# Patient Record
Sex: Male | Born: 1955 | Race: White | Hispanic: No | Marital: Married
Health system: Southern US, Community
[De-identification: ages and names within clinical notes are randomized; demographics above are authoritative.]

## PROBLEM LIST (undated history)

## (undated) DIAGNOSIS — K219 Gastro-esophageal reflux disease without esophagitis: Secondary | ICD-10-CM

## (undated) DIAGNOSIS — I10 Essential (primary) hypertension: Secondary | ICD-10-CM

## (undated) DIAGNOSIS — I2510 Atherosclerotic heart disease of native coronary artery without angina pectoris: Secondary | ICD-10-CM

## (undated) DIAGNOSIS — F419 Anxiety disorder, unspecified: Secondary | ICD-10-CM

## (undated) DIAGNOSIS — R55 Syncope and collapse: Secondary | ICD-10-CM

## (undated) DIAGNOSIS — E785 Hyperlipidemia, unspecified: Secondary | ICD-10-CM

## (undated) HISTORY — PX: TONSILLECTOMY: SUR1361

## (undated) HISTORY — PX: CORONARY ANGIOPLASTY: SHX604

## (undated) HISTORY — PX: APPENDECTOMY: SHX54

---

## 2015-09-07 ENCOUNTER — Encounter
Admission: RE | Disposition: A | Discharge status: Home / Self Care | Payer: Self-pay | Source: Ambulatory Visit | Attending: Gastroenterology

## 2015-09-07 ENCOUNTER — Ambulatory Visit: Payer: BC Managed Care – PPO | Admitting: Anesthesiology

## 2015-09-07 ENCOUNTER — Ambulatory Visit
Admission: RE | Admit: 2015-09-07 | Discharge: 2015-09-07 | Disposition: A | Discharge status: Home / Self Care | Payer: BC Managed Care – PPO | Source: Ambulatory Visit | Attending: Gastroenterology | Admitting: Gastroenterology

## 2015-09-07 ENCOUNTER — Encounter: Payer: Self-pay | Admitting: Anesthesiology

## 2015-09-07 DIAGNOSIS — K219 Gastro-esophageal reflux disease without esophagitis: Secondary | ICD-10-CM | POA: Diagnosis not present

## 2015-09-07 DIAGNOSIS — Z9862 Peripheral vascular angioplasty status: Secondary | ICD-10-CM | POA: Insufficient documentation

## 2015-09-07 DIAGNOSIS — K640 First degree hemorrhoids: Secondary | ICD-10-CM | POA: Insufficient documentation

## 2015-09-07 DIAGNOSIS — Z7982 Long term (current) use of aspirin: Secondary | ICD-10-CM | POA: Insufficient documentation

## 2015-09-07 DIAGNOSIS — E785 Hyperlipidemia, unspecified: Secondary | ICD-10-CM | POA: Insufficient documentation

## 2015-09-07 DIAGNOSIS — K644 Residual hemorrhoidal skin tags: Secondary | ICD-10-CM | POA: Insufficient documentation

## 2015-09-07 DIAGNOSIS — Z79899 Other long term (current) drug therapy: Secondary | ICD-10-CM | POA: Diagnosis not present

## 2015-09-07 DIAGNOSIS — I2510 Atherosclerotic heart disease of native coronary artery without angina pectoris: Secondary | ICD-10-CM | POA: Insufficient documentation

## 2015-09-07 DIAGNOSIS — K625 Hemorrhage of anus and rectum: Secondary | ICD-10-CM | POA: Diagnosis not present

## 2015-09-07 DIAGNOSIS — F419 Anxiety disorder, unspecified: Secondary | ICD-10-CM | POA: Insufficient documentation

## 2015-09-07 DIAGNOSIS — I10 Essential (primary) hypertension: Secondary | ICD-10-CM | POA: Insufficient documentation

## 2015-09-07 HISTORY — DX: Hyperlipidemia, unspecified: E78.5

## 2015-09-07 HISTORY — DX: Atherosclerotic heart disease of native coronary artery without angina pectoris: I25.10

## 2015-09-07 HISTORY — DX: Anxiety disorder, unspecified: F41.9

## 2015-09-07 HISTORY — DX: Syncope and collapse: R55

## 2015-09-07 HISTORY — PX: COLONOSCOPY WITH PROPOFOL: SHX5780

## 2015-09-07 HISTORY — DX: Essential (primary) hypertension: I10

## 2015-09-07 HISTORY — DX: Gastro-esophageal reflux disease without esophagitis: K21.9

## 2015-09-07 SURGERY — COLONOSCOPY WITH PROPOFOL
Anesthesia: General

## 2015-09-07 MED ORDER — PROPOFOL 500 MG/50ML IV EMUL
INTRAVENOUS | Status: DC | PRN
  Administered 2015-09-07: 120 ug/kg/min via INTRAVENOUS

## 2015-09-07 MED ORDER — SODIUM CHLORIDE 0.9 % IV SOLN: INTRAVENOUS | Status: DC

## 2015-09-07 MED ORDER — ONDANSETRON HCL 4 MG/2ML IJ SOLN: 4.0000 mg | Freq: Once | INTRAMUSCULAR | Status: DC | PRN

## 2015-09-07 MED ORDER — SODIUM CHLORIDE 0.9 % IR SOLN: 1000.0000 mL | Freq: Once | Status: DC

## 2015-09-07 MED ORDER — SODIUM CHLORIDE 0.9 % IV SOLN
INTRAVENOUS | Status: DC
  Administered 2015-09-07: 14:00:00 via INTRAVENOUS

## 2015-09-07 MED ORDER — FENTANYL CITRATE (PF) 100 MCG/2ML IJ SOLN: 25.0000 ug | INTRAMUSCULAR | Status: DC | PRN

## 2015-09-07 NOTE — Transfer of Care (Signed)
Immediate Anesthesia Transfer of Care Note  Patient: Henry Dennis  Procedure(s) Performed: Procedure(s): COLONOSCOPY WITH PROPOFOL (N/A)  Patient Location: PACU  Anesthesia Type:General  Level of Consciousness: awake, alert , oriented and sedated  Airway & Oxygen Therapy: Patient Spontanous Breathing and Patient connected to nasal cannula oxygen  Post-op Assessment: Report given to RN and Post -op Vital signs reviewed and stable  Post vital signs: Reviewed and stable  Last Vitals:  Filed Vitals:   09/07/15 1424  BP: 166/98  Pulse: 79  Temp: 36.1 C  Resp: 18    Complications: No apparent anesthesia complications

## 2015-09-07 NOTE — Anesthesia Postprocedure Evaluation (Signed)
Anesthesia Post Note  Patient: Henry Dennis  Procedure(s) Performed: Procedure(s) (LRB): COLONOSCOPY WITH PROPOFOL (N/A)  Patient location during evaluation: PACU Anesthesia Type: General Level of consciousness: awake and alert and oriented Pain management: pain level controlled Vital Signs Assessment: post-procedure vital signs reviewed and stable Respiratory status: spontaneous breathing Cardiovascular status: blood pressure returned to baseline Anesthetic complications: no    Last Vitals:  Filed Vitals:   09/07/15 1522 09/07/15 1532  BP: 110/63 136/78  Pulse: 60 64  Temp: 36.6 C   Resp: 12 18    Last Pain: There were no vitals filed for this visit.               Ariely Riddell

## 2015-09-07 NOTE — Anesthesia Procedure Notes (Signed)
Performed by: COOK-MARTIN, Kenijah Ben[place] Pre-anesthesia Checklist: Patient identified, Emergency Drugs available, Suction available, Patient being monitored and Timeout performed Patient Re-evaluated:Patient Re-evaluated prior to inductionOxygen Delivery Method: Nasal cannula Preoxygenation: Pre-oxygenation with 100% oxygen Intubation Type: IV induction Placement Confirmation: positive ETCO2 and CO2 detector       

## 2015-09-07 NOTE — Anesthesia Preprocedure Evaluation (Addendum)
Anesthesia Evaluation  Patient identified by MRN, date of birth, ID band Patient awake    Reviewed: Allergy & Precautions, NPO status , Patient's Chart, lab work & pertinent test results  History of Anesthesia Complications (+) AWARENESS UNDER ANESTHESIA  Airway Mallampati: III  TM Distance: <3 FB     Dental  (+) Chipped   Pulmonary  Sinus allergies   Pulmonary exam normal breath sounds clear to auscultation       Cardiovascular hypertension, Pt. on medications + CAD  Normal cardiovascular exam  Hx of coronary stent   Neuro/Psych    GI/Hepatic GERD  Medicated and Controlled,  Endo/Other    Renal/GU      Musculoskeletal   Abdominal Normal abdominal exam  (+)   Peds  Hematology   Anesthesia Other Findings Increased cholesterol  Reproductive/Obstetrics                          Anesthesia Physical Anesthesia Plan  ASA: III  Anesthesia Plan: General   Post-op Pain Management:    Induction: Intravenous  Airway Management Planned: Nasal Cannula  Additional Equipment:   Intra-op Plan:   Post-operative Plan:   Informed Consent: I have reviewed the patients History and Physical, chart, labs and discussed the procedure including the risks, benefits and alternatives for the proposed anesthesia with the patient or authorized representative who has indicated his/her understanding and acceptance.   Dental advisory given  Plan Discussed with: CRNA and Surgeon  Anesthesia Plan Comments:         Anesthesia Quick Evaluation

## 2015-09-07 NOTE — H&P (Signed)
  Primary Care Physician:  Berneice Gandy, MD  Pre-Procedure History & Physical: HPI:  Henry Dennis is a 60 y.o. male is here for an colonoscopy.   Past Medical History  Diagnosis Date  . Anxiety   . Coronary artery disease   . GERD (gastroesophageal reflux disease)   . Hyperlipidemia   . Hypertension   . Syncope     Past Surgical History  Procedure Laterality Date  . Appendectomy    . Coronary angioplasty    . Tonsillectomy      Prior to Admission medications   Medication Sig Start Date End Date Taking? Authorizing Provider  aspirin 162 MG EC tablet Take 325 mg by mouth daily.    Yes Historical Provider, MD  atorvastatin (LIPITOR) 80 MG tablet Take 80 mg by mouth daily.   Yes Historical Provider, MD  cetirizine (ZYRTEC) 10 MG tablet Take 10 mg by mouth daily.   Yes Historical Provider, MD  lisinopril-hydrochlorothiazide (PRINZIDE,ZESTORETIC) 20-25 MG tablet Take 1 tablet by mouth daily.   Yes Historical Provider, MD  ranitidine (ZANTAC) 150 MG tablet Take 150 mg by mouth 2 (two) times daily.   Yes Historical Provider, MD  sildenafil (VIAGRA) 25 MG tablet Take 25 mg by mouth daily as needed for erectile dysfunction.   Yes Historical Provider, MD  nitroGLYCERIN (NITROSTAT) 0.4 MG SL tablet Place 0.4 mg under the tongue every 5 (five) minutes as needed for chest pain. Reported on 09/07/2015    Historical Provider, MD    Allergies as of 09/07/2015  . (Not on File)    History reviewed. No pertinent family history.  Social History   Social History  . Marital Status: Married    Spouse Name: N/A  . Number of Children: N/A  . Years of Education: N/A   Occupational History  . Not on file.   Social History Main Topics  . Smoking status: Not on file  . Smokeless tobacco: Not on file  . Alcohol Use: Not on file  . Drug Use: Not on file  . Sexual Activity: Not on file   Other Topics Concern  . Not on file   Social History Narrative  . No narrative on file      Physical Exam: BP 166/98 mmHg  Pulse 79  Temp(Src) 97 F (36.1 C) (Oral)  Resp 18  Ht  (1.778 m)  Wt 97.07 kg (214 lb)  BMI 30.71 kg/m2  SpO2 99% General:   Alert,  pleasant and cooperative in NAD Head:  Normocephalic and atraumatic. Neck:  Supple; no masses or thyromegaly. Lungs:  Clear throughout to auscultation.    Heart:  Regular rate and rhythm. Abdomen:  Soft, nontender and nondistended. Normal bowel sounds, without guarding, and without rebound.   Neurologic:  Alert and  oriented x4;  grossly normal neurologically.  Impression/Plan: Henry Dennis is here for an colonoscopy to be performed for rectal bleeding  Risks, benefits, limitations, and alternatives regarding  colonoscopy have been reviewed with the patient.  Questions have been answered.  All parties agreeable.   Henry Maxwell, MD  09/07/2015, 2:46 PM

## 2015-09-07 NOTE — Op Note (Signed)
[facility] Patient Name: Henry Dennis Procedure Date: 09/07/2015 2:57 PM MRN: [id number] Account #: [id number] Date of Birth: 1955-11-15 Admit Type: Outpatient Age: 60 Room: [facility] ENDO ROOM 2 Gender: Male Note Status: Finalized Procedure:         Colonoscopy Indications:       This is the patient's first colonoscopy, Rectal bleeding Patient Profile:   This is a 60 year old male. Providers:         Rhona Raider. Shelle Iron, MD Referring MD:      Donnie Aho MD Medicines:         Propofol per Anesthesia Complications:     No immediate complications. Procedure:         Pre-Anesthesia Assessment:                    - Prior to the procedure, a History and Physical was                     performed, and patient medications, allergies and                     sensitivities were reviewed. The patient's tolerance of                     previous anesthesia was reviewed.                    After obtaining informed consent, the colonoscope was                     passed under direct vision. Throughout the procedure, the                     patient's blood pressure, pulse, and oxygen saturations                     were monitored continuously. The Colonoscope was                     introduced through the anus and advanced to the the cecum,                     identified by appendiceal orifice and ileocecal valve. The                     colonoscopy was performed without difficulty. The patient                     tolerated the procedure well. The quality of the bowel                     preparation was good. Findings:      The perianal exam findings include non-thrombosed external hemorrhoids.      Internal hemorrhoids were found during retroflexion. The hemorrhoids       were Grade I (internal hemorrhoids that do not prolapse).      The exam was otherwise without abnormality. Impression:        - Non-thrombosed external hemorrhoids found on perianal              exam.                    - Internal hemorrhoids.                    -  The examination was otherwise normal.                    - No specimens collected.                    - Rectal bleeding likely hemorrhoidal in etiology. Recommendation:    - Observe patient in GI recovery unit.                    - Resume regular diet.                    - Continue present medications.                    - Repeat colonoscopy in 10 years for screening purposes.                    - Return to referring physician.                    - The findings and recommendations were discussed with the                     patient.                    - The findings and recommendations were discussed with the                     patient's family. Procedure Code(s): --- Professional ---                    [phone], Colonoscopy, flexible; diagnostic, including                     collection of specimen(s) by brushing or washing, when                     performed (separate procedure) Diagnosis Code(s): --- Professional ---                    K64.0, First degree hemorrhoids                    K64.4, Residual hemorrhoidal skin tags                    K62.5, Hemorrhage of anus and rectum CPT copyright 2014 [employer]. All rights reserved. The codes documented in this report are preliminary and upon coder review may  be revised to meet current compliance requirements. Kathalene Frames, MD 09/07/2015 3:22:53 PM This report has been signed electronically. Number of Addenda: 0 Note Initiated On: 09/07/2015 2:57 PM Scope Withdrawal Time: 0 hours 10 minutes 19 seconds  Total Procedure Duration: 0 hours 16 minutes 45 seconds       [facility]

## 2015-09-07 NOTE — Discharge Instructions (Signed)

## 2015-09-09 ENCOUNTER — Encounter: Payer: Self-pay | Admitting: Gastroenterology

## 2018-08-14 ENCOUNTER — Other Ambulatory Visit: Payer: Self-pay | Admitting: Family Medicine

## 2018-08-14 DIAGNOSIS — R590 Localized enlarged lymph nodes: Secondary | ICD-10-CM

## 2018-08-21 ENCOUNTER — Ambulatory Visit
Admission: RE | Admit: 2018-08-21 | Discharge: 2018-08-21 | Disposition: A | Discharge status: Home / Self Care | Payer: BC Managed Care – PPO | Source: Ambulatory Visit | Attending: Family Medicine | Admitting: Family Medicine

## 2018-08-21 ENCOUNTER — Encounter (INDEPENDENT_AMBULATORY_CARE_PROVIDER_SITE_OTHER): Payer: Self-pay

## 2018-08-21 DIAGNOSIS — R590 Localized enlarged lymph nodes: Secondary | ICD-10-CM | POA: Diagnosis not present

## 2018-08-21 MED ORDER — IOHEXOL 300 MG/ML  SOLN
75.0000 mL | Freq: Once | INTRAMUSCULAR | Status: AC | PRN
  Administered 2018-08-21: 75 mL via INTRAVENOUS

## 2019-02-20 IMAGING — CT CT NECK W/ CM
2 of 3 series · 8 of 14 positions shown, 9 images · IV contrast (omnipaque)
Comparison: None.

CLINICAL DATA: Right neck mass.

EXAM:
CT NECK WITH CONTRAST
TECHNIQUE: Multidetector CT imaging of the neck was performed using the
standard protocol following the bolus administration of intravenous
contrast.
CONTRAST:  75mL OMNIPAQUE IOHEXOL 300 MG/ML  SOLN

[Series 2: axial neck · axial · 0.61mm/px · z∈[-384,-224]mm · 4 of 134 slices shown]
[im 27/134  bone]
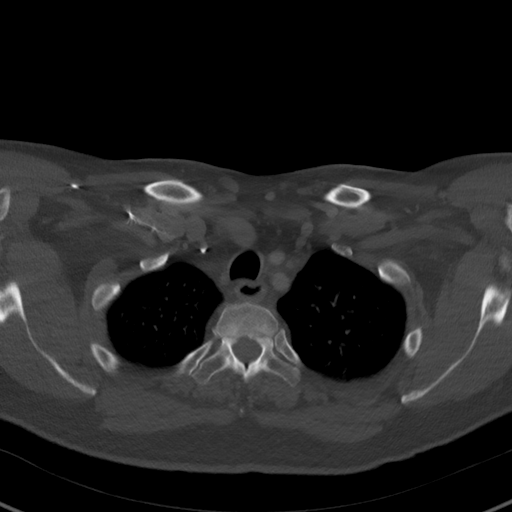
[im 54/134  bone]
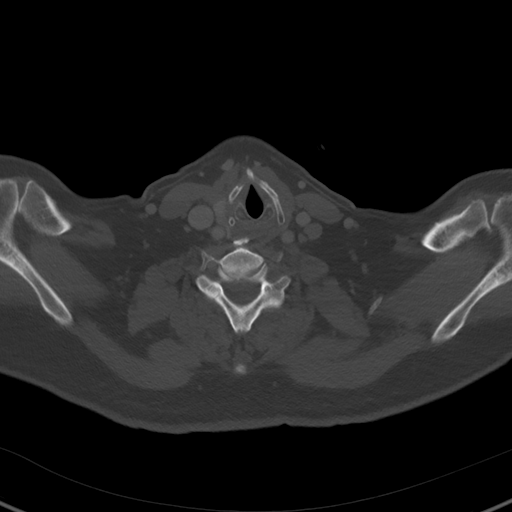
[im 80/134  bone]
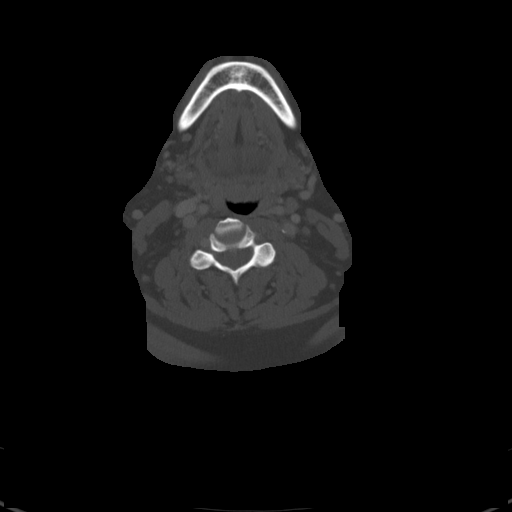
[im 107/134  bone]
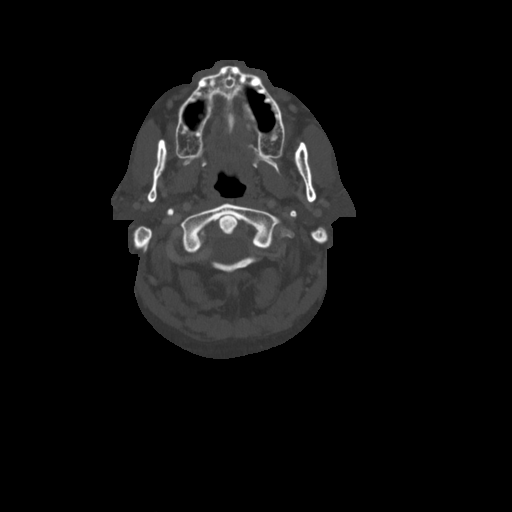

[Series 8: orthogonal ax · axial · 0.42mm/px · z∈[-430,-243]mm · 4 of 161 slices shown, 5 images]
[im 33/161  soft-tissue]
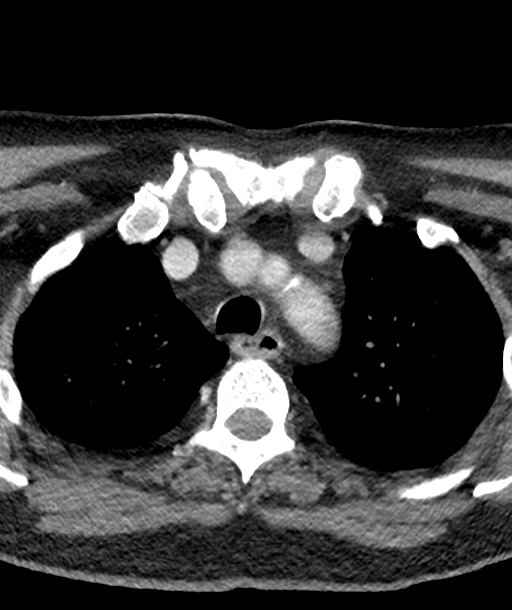
[im 33/161  bone]
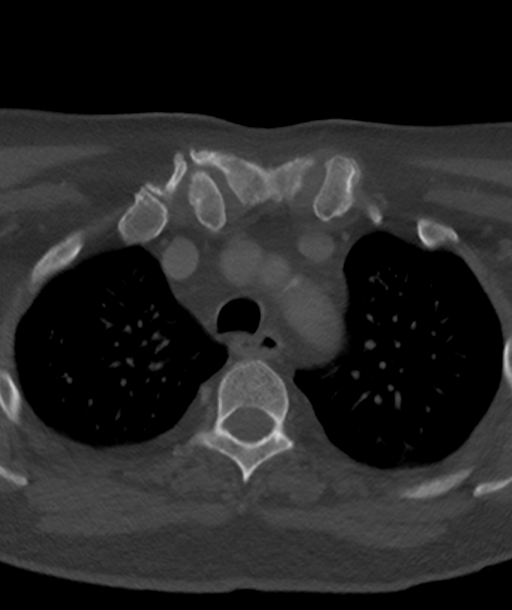
[im 65/161  bone]
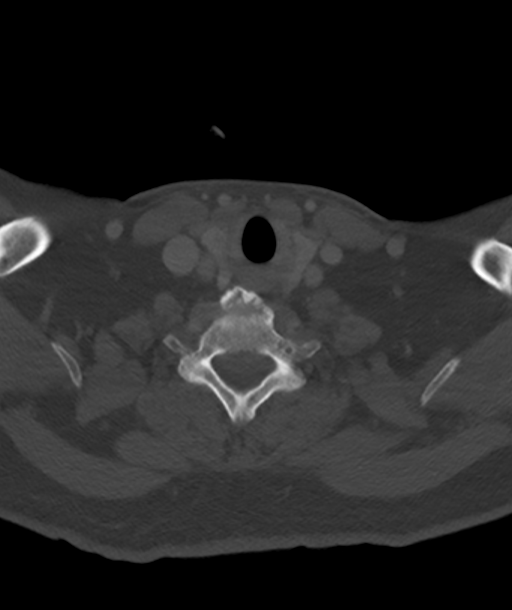
[im 97/161  bone]
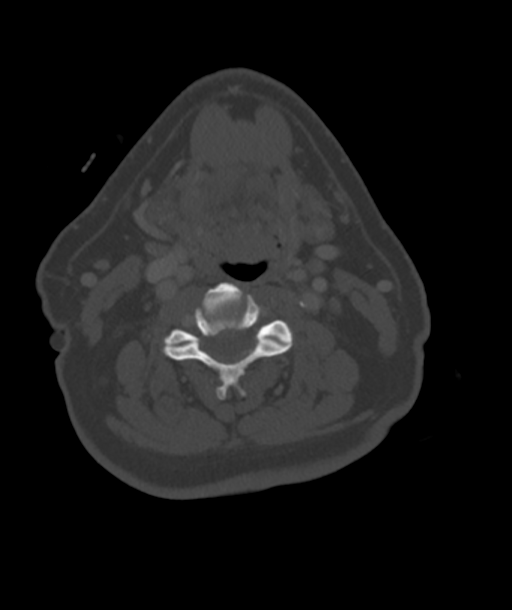
[im 129/161  bone]
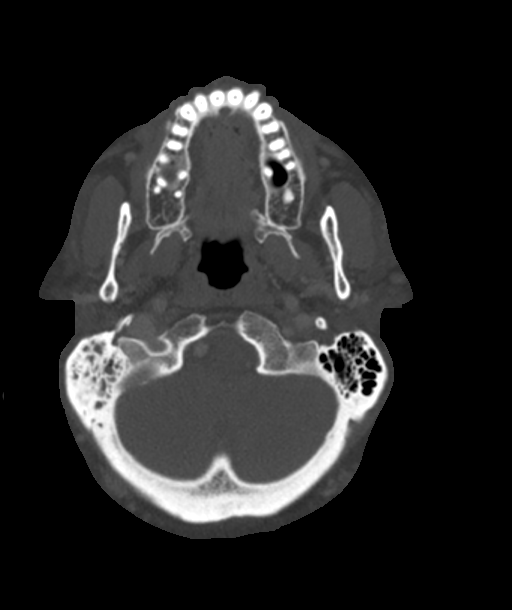

[8 of 14 positions shown; findings below may reference images not displayed]

FINDINGS: Pharynx and larynx: Prominent lingual tonsil bilaterally without
focal mass. Remainder of the pharynx normal. Normal airway. Normal
larynx.

Salivary glands: No inflammation, mass, or stone.

Thyroid: Negative

Lymph nodes: No enlarged lymph nodes. Vitamin-E capsule placed in
the right lateral neck at the area of patient concern. No mass or
adenopathy in this area.

Vascular: Normal vascular enhancement bilaterally. Mild
calcification carotid bifurcation bilaterally.

Limited intracranial: Negative

Visualized orbits: Negative

Mastoids and visualized paranasal sinuses: Mild mucosal edema right
maxillary sinus and right mastoid sinus otherwise clear

Skeleton: No acute skeletal abnormality.

Upper chest: Lung apices clear bilaterally.

Other: None
IMPRESSION: No imaging correlate of palpable area in the right lateral neck.

Prominent lingual tonsil bilaterally without definite mass lesion.
If there is further concern of this finding, direct visualization
could be performed.

## 2019-09-14 ENCOUNTER — Ambulatory Visit: Payer: BC Managed Care – PPO

## 2019-09-22 ENCOUNTER — Ambulatory Visit: Payer: Medicare PPO | Attending: Internal Medicine

## 2019-09-22 DIAGNOSIS — Z23 Encounter for immunization: Secondary | ICD-10-CM

## 2019-09-22 NOTE — Progress Notes (Signed)
   Covid-19 Vaccination Clinic  Name:  Henry Dennis    MRN: [id number] DOB: 1956-02-04  09/22/2019  Henry Dennis was observed post Covid-19 immunization for 30 minutes based on pre-vaccination screening without incidence. He was provided with Vaccine Information Sheet and instruction to access the V-Safe system.   Henry Dennis was instructed to call 911 with any severe reactions post vaccine: Marland Kitchen Difficulty breathing  . Swelling of your face and throat  . A fast heartbeat  . A bad rash all over your body  . Dizziness and weakness    Immunizations Administered    Name Date Dose VIS Date Route   Pfizer COVID-19 Vaccine 09/22/2019  9:11 AM 0.3 mL 07/26/2019 Intramuscular   Manufacturer: [employer], [employer]   Lot: [id number]   NDC: [address]-7

## 2019-09-25 ENCOUNTER — Ambulatory Visit: Payer: BC Managed Care – PPO

## 2019-10-16 ENCOUNTER — Ambulatory Visit: Payer: Medicare PPO | Attending: Internal Medicine

## 2019-10-16 DIAGNOSIS — Z23 Encounter for immunization: Secondary | ICD-10-CM

## 2019-10-16 NOTE — Progress Notes (Signed)
   Covid-19 Vaccination Clinic  Name:  Henry Dennis    MRN: [id number] DOB: 02/03/1956  10/16/2019  [name] was observed post Covid-19 immunization for 15 minutes without incident. He was provided with Vaccine Information Sheet and instruction to access the V-Safe system.   [name] was instructed to call 911 with any severe reactions post vaccine: Marland Kitchen Difficulty breathing  . Swelling of face and throat  . A fast heartbeat  . A bad rash all over body  . Dizziness and weakness   Immunizations Administered    Name Date Dose VIS Date Route   Pfizer COVID-19 Vaccine 10/16/2019  3:34 PM 0.3 mL 07/26/2019 Intramuscular   Manufacturer: [employer], [employer]   Lot: [id number]   NDC: [address]-5

## 2022-04-27 DIAGNOSIS — Z955 Presence of coronary angioplasty implant and graft: Secondary | ICD-10-CM | POA: Diagnosis not present

## 2022-04-27 DIAGNOSIS — Z125 Encounter for screening for malignant neoplasm of prostate: Secondary | ICD-10-CM | POA: Diagnosis not present

## 2022-04-27 DIAGNOSIS — E119 Type 2 diabetes mellitus without complications: Secondary | ICD-10-CM | POA: Diagnosis not present

## 2022-04-27 DIAGNOSIS — E7800 Pure hypercholesterolemia, unspecified: Secondary | ICD-10-CM | POA: Diagnosis not present

## 2022-04-27 DIAGNOSIS — K219 Gastro-esophageal reflux disease without esophagitis: Secondary | ICD-10-CM | POA: Diagnosis not present

## 2022-04-27 DIAGNOSIS — I10 Essential (primary) hypertension: Secondary | ICD-10-CM | POA: Diagnosis not present

## 2022-05-26 DIAGNOSIS — H25042 Posterior subcapsular polar age-related cataract, left eye: Secondary | ICD-10-CM | POA: Diagnosis not present

## 2022-05-26 DIAGNOSIS — I10 Essential (primary) hypertension: Secondary | ICD-10-CM | POA: Diagnosis not present

## 2022-05-26 DIAGNOSIS — H0288B Meibomian gland dysfunction left eye, upper and lower eyelids: Secondary | ICD-10-CM | POA: Diagnosis not present

## 2022-05-26 DIAGNOSIS — H0288A Meibomian gland dysfunction right eye, upper and lower eyelids: Secondary | ICD-10-CM | POA: Diagnosis not present

## 2022-05-26 DIAGNOSIS — H25013 Cortical age-related cataract, bilateral: Secondary | ICD-10-CM | POA: Diagnosis not present

## 2022-05-26 DIAGNOSIS — H2513 Age-related nuclear cataract, bilateral: Secondary | ICD-10-CM | POA: Diagnosis not present

## 2022-05-26 DIAGNOSIS — H1045 Other chronic allergic conjunctivitis: Secondary | ICD-10-CM | POA: Diagnosis not present

## 2022-05-30 DIAGNOSIS — Z23 Encounter for immunization: Secondary | ICD-10-CM | POA: Diagnosis not present

## 2022-06-28 DIAGNOSIS — E7800 Pure hypercholesterolemia, unspecified: Secondary | ICD-10-CM | POA: Diagnosis not present

## 2022-06-28 DIAGNOSIS — I10 Essential (primary) hypertension: Secondary | ICD-10-CM | POA: Diagnosis not present

## 2022-06-29 DIAGNOSIS — Z125 Encounter for screening for malignant neoplasm of prostate: Secondary | ICD-10-CM | POA: Diagnosis not present

## 2022-07-01 DIAGNOSIS — H903 Sensorineural hearing loss, bilateral: Secondary | ICD-10-CM | POA: Diagnosis not present

## 2022-08-19 DIAGNOSIS — H1045 Other chronic allergic conjunctivitis: Secondary | ICD-10-CM | POA: Diagnosis not present

## 2022-08-19 DIAGNOSIS — H538 Other visual disturbances: Secondary | ICD-10-CM | POA: Diagnosis not present

## 2022-08-19 DIAGNOSIS — H0288A Meibomian gland dysfunction right eye, upper and lower eyelids: Secondary | ICD-10-CM | POA: Diagnosis not present

## 2022-08-19 DIAGNOSIS — H2513 Age-related nuclear cataract, bilateral: Secondary | ICD-10-CM | POA: Diagnosis not present

## 2022-08-19 DIAGNOSIS — H0288B Meibomian gland dysfunction left eye, upper and lower eyelids: Secondary | ICD-10-CM | POA: Diagnosis not present

## 2022-08-19 DIAGNOSIS — H25043 Posterior subcapsular polar age-related cataract, bilateral: Secondary | ICD-10-CM | POA: Diagnosis not present

## 2022-08-19 DIAGNOSIS — H25013 Cortical age-related cataract, bilateral: Secondary | ICD-10-CM | POA: Diagnosis not present

## 2022-08-19 DIAGNOSIS — I10 Essential (primary) hypertension: Secondary | ICD-10-CM | POA: Diagnosis not present

## 2022-09-15 DIAGNOSIS — F419 Anxiety disorder, unspecified: Secondary | ICD-10-CM | POA: Diagnosis not present

## 2022-09-15 DIAGNOSIS — E119 Type 2 diabetes mellitus without complications: Secondary | ICD-10-CM | POA: Diagnosis not present

## 2022-10-04 DIAGNOSIS — I10 Essential (primary) hypertension: Secondary | ICD-10-CM | POA: Diagnosis not present

## 2022-10-04 DIAGNOSIS — E119 Type 2 diabetes mellitus without complications: Secondary | ICD-10-CM | POA: Diagnosis not present

## 2022-10-04 DIAGNOSIS — E7800 Pure hypercholesterolemia, unspecified: Secondary | ICD-10-CM | POA: Diagnosis not present

## 2022-10-18 DIAGNOSIS — Z9181 History of falling: Secondary | ICD-10-CM | POA: Diagnosis not present

## 2022-10-18 DIAGNOSIS — F419 Anxiety disorder, unspecified: Secondary | ICD-10-CM | POA: Diagnosis not present

## 2022-10-18 DIAGNOSIS — E785 Hyperlipidemia, unspecified: Secondary | ICD-10-CM | POA: Diagnosis not present

## 2022-10-18 DIAGNOSIS — E119 Type 2 diabetes mellitus without complications: Secondary | ICD-10-CM | POA: Diagnosis not present

## 2022-10-18 DIAGNOSIS — N529 Male erectile dysfunction, unspecified: Secondary | ICD-10-CM | POA: Diagnosis not present

## 2022-10-18 DIAGNOSIS — I10 Essential (primary) hypertension: Secondary | ICD-10-CM | POA: Diagnosis not present

## 2022-10-18 DIAGNOSIS — Z8249 Family history of ischemic heart disease and other diseases of the circulatory system: Secondary | ICD-10-CM | POA: Diagnosis not present

## 2022-10-18 DIAGNOSIS — J309 Allergic rhinitis, unspecified: Secondary | ICD-10-CM | POA: Diagnosis not present

## 2022-10-18 DIAGNOSIS — K219 Gastro-esophageal reflux disease without esophagitis: Secondary | ICD-10-CM | POA: Diagnosis not present

## 2022-10-19 DIAGNOSIS — E119 Type 2 diabetes mellitus without complications: Secondary | ICD-10-CM | POA: Diagnosis not present

## 2022-10-26 DIAGNOSIS — I10 Essential (primary) hypertension: Secondary | ICD-10-CM | POA: Diagnosis not present

## 2022-10-26 DIAGNOSIS — E7800 Pure hypercholesterolemia, unspecified: Secondary | ICD-10-CM | POA: Diagnosis not present

## 2022-10-26 DIAGNOSIS — Z1331 Encounter for screening for depression: Secondary | ICD-10-CM | POA: Diagnosis not present

## 2022-10-26 DIAGNOSIS — K219 Gastro-esophageal reflux disease without esophagitis: Secondary | ICD-10-CM | POA: Diagnosis not present

## 2022-10-26 DIAGNOSIS — Z955 Presence of coronary angioplasty implant and graft: Secondary | ICD-10-CM | POA: Diagnosis not present

## 2022-10-26 DIAGNOSIS — Z0000 Encounter for general adult medical examination without abnormal findings: Secondary | ICD-10-CM | POA: Diagnosis not present

## 2022-10-26 DIAGNOSIS — E119 Type 2 diabetes mellitus without complications: Secondary | ICD-10-CM | POA: Diagnosis not present

## 2022-10-26 DIAGNOSIS — F419 Anxiety disorder, unspecified: Secondary | ICD-10-CM | POA: Diagnosis not present

## 2022-12-19 DIAGNOSIS — R0789 Other chest pain: Secondary | ICD-10-CM | POA: Diagnosis not present

## 2022-12-19 DIAGNOSIS — I2510 Atherosclerotic heart disease of native coronary artery without angina pectoris: Secondary | ICD-10-CM | POA: Diagnosis not present

## 2022-12-19 DIAGNOSIS — Z955 Presence of coronary angioplasty implant and graft: Secondary | ICD-10-CM | POA: Diagnosis not present

## 2022-12-27 DIAGNOSIS — H903 Sensorineural hearing loss, bilateral: Secondary | ICD-10-CM | POA: Diagnosis not present

## 2023-01-27 DIAGNOSIS — H25813 Combined forms of age-related cataract, bilateral: Secondary | ICD-10-CM | POA: Diagnosis not present

## 2023-01-27 DIAGNOSIS — H1045 Other chronic allergic conjunctivitis: Secondary | ICD-10-CM | POA: Diagnosis not present

## 2023-01-27 DIAGNOSIS — H0288B Meibomian gland dysfunction left eye, upper and lower eyelids: Secondary | ICD-10-CM | POA: Diagnosis not present

## 2023-01-27 DIAGNOSIS — H0288A Meibomian gland dysfunction right eye, upper and lower eyelids: Secondary | ICD-10-CM | POA: Diagnosis not present

## 2023-01-27 DIAGNOSIS — I10 Essential (primary) hypertension: Secondary | ICD-10-CM | POA: Diagnosis not present

## 2023-01-27 DIAGNOSIS — H25043 Posterior subcapsular polar age-related cataract, bilateral: Secondary | ICD-10-CM | POA: Diagnosis not present

## 2023-03-07 DIAGNOSIS — E119 Type 2 diabetes mellitus without complications: Secondary | ICD-10-CM | POA: Diagnosis not present

## 2023-03-07 DIAGNOSIS — I10 Essential (primary) hypertension: Secondary | ICD-10-CM | POA: Diagnosis not present

## 2023-04-21 DIAGNOSIS — E119 Type 2 diabetes mellitus without complications: Secondary | ICD-10-CM | POA: Diagnosis not present

## 2023-04-21 DIAGNOSIS — E7800 Pure hypercholesterolemia, unspecified: Secondary | ICD-10-CM | POA: Diagnosis not present

## 2023-04-28 DIAGNOSIS — E7800 Pure hypercholesterolemia, unspecified: Secondary | ICD-10-CM | POA: Diagnosis not present

## 2023-04-28 DIAGNOSIS — F419 Anxiety disorder, unspecified: Secondary | ICD-10-CM | POA: Diagnosis not present

## 2023-04-28 DIAGNOSIS — Z125 Encounter for screening for malignant neoplasm of prostate: Secondary | ICD-10-CM | POA: Diagnosis not present

## 2023-04-28 DIAGNOSIS — K219 Gastro-esophageal reflux disease without esophagitis: Secondary | ICD-10-CM | POA: Diagnosis not present

## 2023-04-28 DIAGNOSIS — R5383 Other fatigue: Secondary | ICD-10-CM | POA: Diagnosis not present

## 2023-04-28 DIAGNOSIS — M25512 Pain in left shoulder: Secondary | ICD-10-CM | POA: Diagnosis not present

## 2023-04-28 DIAGNOSIS — I10 Essential (primary) hypertension: Secondary | ICD-10-CM | POA: Diagnosis not present

## 2023-04-28 DIAGNOSIS — E119 Type 2 diabetes mellitus without complications: Secondary | ICD-10-CM | POA: Diagnosis not present

## 2023-04-28 DIAGNOSIS — Z955 Presence of coronary angioplasty implant and graft: Secondary | ICD-10-CM | POA: Diagnosis not present

## 2023-05-08 DIAGNOSIS — M7552 Bursitis of left shoulder: Secondary | ICD-10-CM | POA: Diagnosis not present

## 2023-05-08 DIAGNOSIS — M25312 Other instability, left shoulder: Secondary | ICD-10-CM | POA: Diagnosis not present

## 2023-05-29 ENCOUNTER — Other Ambulatory Visit: Payer: Self-pay | Admitting: Orthopedic Surgery

## 2023-05-29 DIAGNOSIS — M25312 Other instability, left shoulder: Secondary | ICD-10-CM

## 2023-05-29 DIAGNOSIS — G8929 Other chronic pain: Secondary | ICD-10-CM

## 2023-05-29 DIAGNOSIS — M25512 Pain in left shoulder: Secondary | ICD-10-CM | POA: Diagnosis not present

## 2023-05-29 DIAGNOSIS — Z23 Encounter for immunization: Secondary | ICD-10-CM | POA: Diagnosis not present

## 2023-05-29 DIAGNOSIS — M7552 Bursitis of left shoulder: Secondary | ICD-10-CM

## 2023-06-12 ENCOUNTER — Encounter: Payer: Self-pay | Admitting: Orthopedic Surgery

## 2023-06-14 ENCOUNTER — Ambulatory Visit
Admission: RE | Admit: 2023-06-14 | Discharge: 2023-06-14 | Disposition: A | Discharge status: Home / Self Care | Payer: Medicare PPO | Source: Ambulatory Visit | Attending: Orthopedic Surgery | Admitting: Orthopedic Surgery

## 2023-06-14 DIAGNOSIS — M7582 Other shoulder lesions, left shoulder: Secondary | ICD-10-CM | POA: Diagnosis not present

## 2023-06-14 DIAGNOSIS — G8929 Other chronic pain: Secondary | ICD-10-CM

## 2023-06-14 DIAGNOSIS — M25312 Other instability, left shoulder: Secondary | ICD-10-CM

## 2023-06-14 DIAGNOSIS — M7552 Bursitis of left shoulder: Secondary | ICD-10-CM

## 2023-06-14 DIAGNOSIS — M25512 Pain in left shoulder: Secondary | ICD-10-CM | POA: Diagnosis not present

## 2023-06-29 DIAGNOSIS — I10 Essential (primary) hypertension: Secondary | ICD-10-CM | POA: Diagnosis not present

## 2023-06-29 DIAGNOSIS — E7800 Pure hypercholesterolemia, unspecified: Secondary | ICD-10-CM | POA: Diagnosis not present

## 2023-06-29 DIAGNOSIS — I2510 Atherosclerotic heart disease of native coronary artery without angina pectoris: Secondary | ICD-10-CM | POA: Diagnosis not present

## 2023-08-03 DIAGNOSIS — H1045 Other chronic allergic conjunctivitis: Secondary | ICD-10-CM | POA: Diagnosis not present

## 2023-08-03 DIAGNOSIS — H25043 Posterior subcapsular polar age-related cataract, bilateral: Secondary | ICD-10-CM | POA: Diagnosis not present

## 2023-08-03 DIAGNOSIS — H0288B Meibomian gland dysfunction left eye, upper and lower eyelids: Secondary | ICD-10-CM | POA: Diagnosis not present

## 2023-08-03 DIAGNOSIS — H25813 Combined forms of age-related cataract, bilateral: Secondary | ICD-10-CM | POA: Diagnosis not present

## 2023-08-03 DIAGNOSIS — H0288A Meibomian gland dysfunction right eye, upper and lower eyelids: Secondary | ICD-10-CM | POA: Diagnosis not present

## 2023-08-03 DIAGNOSIS — I10 Essential (primary) hypertension: Secondary | ICD-10-CM | POA: Diagnosis not present

## 2023-08-17 DIAGNOSIS — J069 Acute upper respiratory infection, unspecified: Secondary | ICD-10-CM | POA: Diagnosis not present

## 2023-10-23 DIAGNOSIS — Z125 Encounter for screening for malignant neoplasm of prostate: Secondary | ICD-10-CM | POA: Diagnosis not present

## 2023-10-23 DIAGNOSIS — E7800 Pure hypercholesterolemia, unspecified: Secondary | ICD-10-CM | POA: Diagnosis not present

## 2023-10-23 DIAGNOSIS — E119 Type 2 diabetes mellitus without complications: Secondary | ICD-10-CM | POA: Diagnosis not present

## 2023-10-30 DIAGNOSIS — E119 Type 2 diabetes mellitus without complications: Secondary | ICD-10-CM | POA: Diagnosis not present

## 2023-10-30 DIAGNOSIS — K219 Gastro-esophageal reflux disease without esophagitis: Secondary | ICD-10-CM | POA: Diagnosis not present

## 2023-10-30 DIAGNOSIS — I10 Essential (primary) hypertension: Secondary | ICD-10-CM | POA: Diagnosis not present

## 2023-10-30 DIAGNOSIS — Z955 Presence of coronary angioplasty implant and graft: Secondary | ICD-10-CM | POA: Diagnosis not present

## 2023-10-30 DIAGNOSIS — Z1331 Encounter for screening for depression: Secondary | ICD-10-CM | POA: Diagnosis not present

## 2023-10-30 DIAGNOSIS — Z0000 Encounter for general adult medical examination without abnormal findings: Secondary | ICD-10-CM | POA: Diagnosis not present

## 2023-10-30 DIAGNOSIS — F419 Anxiety disorder, unspecified: Secondary | ICD-10-CM | POA: Diagnosis not present

## 2023-10-30 DIAGNOSIS — E7800 Pure hypercholesterolemia, unspecified: Secondary | ICD-10-CM | POA: Diagnosis not present

## 2023-12-22 DIAGNOSIS — H903 Sensorineural hearing loss, bilateral: Secondary | ICD-10-CM | POA: Diagnosis not present

## 2024-02-02 DIAGNOSIS — E119 Type 2 diabetes mellitus without complications: Secondary | ICD-10-CM | POA: Diagnosis not present

## 2024-02-02 DIAGNOSIS — E7800 Pure hypercholesterolemia, unspecified: Secondary | ICD-10-CM | POA: Diagnosis not present

## 2024-02-02 DIAGNOSIS — I10 Essential (primary) hypertension: Secondary | ICD-10-CM | POA: Diagnosis not present

## 2024-02-02 DIAGNOSIS — F419 Anxiety disorder, unspecified: Secondary | ICD-10-CM | POA: Diagnosis not present

## 2024-04-04 DIAGNOSIS — Z1331 Encounter for screening for depression: Secondary | ICD-10-CM | POA: Diagnosis not present

## 2024-04-04 DIAGNOSIS — U071 COVID-19: Secondary | ICD-10-CM | POA: Diagnosis not present

## 2024-04-11 DIAGNOSIS — F419 Anxiety disorder, unspecified: Secondary | ICD-10-CM | POA: Diagnosis not present

## 2024-04-11 DIAGNOSIS — E119 Type 2 diabetes mellitus without complications: Secondary | ICD-10-CM | POA: Diagnosis not present

## 2024-04-11 DIAGNOSIS — E7800 Pure hypercholesterolemia, unspecified: Secondary | ICD-10-CM | POA: Diagnosis not present

## 2024-04-11 DIAGNOSIS — I10 Essential (primary) hypertension: Secondary | ICD-10-CM | POA: Diagnosis not present

## 2024-04-24 DIAGNOSIS — E7800 Pure hypercholesterolemia, unspecified: Secondary | ICD-10-CM | POA: Diagnosis not present

## 2024-04-24 DIAGNOSIS — E119 Type 2 diabetes mellitus without complications: Secondary | ICD-10-CM | POA: Diagnosis not present

## 2024-05-01 DIAGNOSIS — I2510 Atherosclerotic heart disease of native coronary artery without angina pectoris: Secondary | ICD-10-CM | POA: Diagnosis not present

## 2024-05-01 DIAGNOSIS — I10 Essential (primary) hypertension: Secondary | ICD-10-CM | POA: Diagnosis not present

## 2024-05-02 DIAGNOSIS — Z1331 Encounter for screening for depression: Secondary | ICD-10-CM | POA: Diagnosis not present

## 2024-05-02 DIAGNOSIS — E119 Type 2 diabetes mellitus without complications: Secondary | ICD-10-CM | POA: Diagnosis not present

## 2024-05-02 DIAGNOSIS — F419 Anxiety disorder, unspecified: Secondary | ICD-10-CM | POA: Diagnosis not present

## 2024-05-02 DIAGNOSIS — I10 Essential (primary) hypertension: Secondary | ICD-10-CM | POA: Diagnosis not present

## 2024-05-02 DIAGNOSIS — E7800 Pure hypercholesterolemia, unspecified: Secondary | ICD-10-CM | POA: Diagnosis not present

## 2024-05-02 DIAGNOSIS — K219 Gastro-esophageal reflux disease without esophagitis: Secondary | ICD-10-CM | POA: Diagnosis not present

## 2024-05-02 DIAGNOSIS — Z125 Encounter for screening for malignant neoplasm of prostate: Secondary | ICD-10-CM | POA: Diagnosis not present

## 2024-05-02 DIAGNOSIS — Z955 Presence of coronary angioplasty implant and graft: Secondary | ICD-10-CM | POA: Diagnosis not present

## 2024-05-07 DIAGNOSIS — H25043 Posterior subcapsular polar age-related cataract, bilateral: Secondary | ICD-10-CM | POA: Diagnosis not present

## 2024-05-07 DIAGNOSIS — H25813 Combined forms of age-related cataract, bilateral: Secondary | ICD-10-CM | POA: Diagnosis not present

## 2024-05-07 DIAGNOSIS — I10 Essential (primary) hypertension: Secondary | ICD-10-CM | POA: Diagnosis not present

## 2024-05-07 DIAGNOSIS — H0288B Meibomian gland dysfunction left eye, upper and lower eyelids: Secondary | ICD-10-CM | POA: Diagnosis not present

## 2024-05-07 DIAGNOSIS — H0288A Meibomian gland dysfunction right eye, upper and lower eyelids: Secondary | ICD-10-CM | POA: Diagnosis not present

## 2024-05-07 DIAGNOSIS — H1045 Other chronic allergic conjunctivitis: Secondary | ICD-10-CM | POA: Diagnosis not present

## 2024-06-05 DIAGNOSIS — Z23 Encounter for immunization: Secondary | ICD-10-CM | POA: Diagnosis not present

## 2024-06-26 DIAGNOSIS — H903 Sensorineural hearing loss, bilateral: Secondary | ICD-10-CM | POA: Diagnosis not present
